# Patient Record
Sex: Female | Born: 1980 | Race: Black or African American | Hispanic: No | Marital: Single | Smoking: Current every day smoker
Health system: Southern US, Community
[De-identification: ages and names within clinical notes are randomized; demographics above are authoritative.]

---

## 2000-08-28 ENCOUNTER — Encounter: Payer: Self-pay | Admitting: Emergency Medicine

## 2000-08-28 ENCOUNTER — Emergency Department (HOSPITAL_COMMUNITY): Admission: EM | Admit: 2000-08-28 | Discharge: 2000-08-28 | Payer: Self-pay | Admitting: Emergency Medicine

## 2001-09-16 ENCOUNTER — Emergency Department (HOSPITAL_COMMUNITY): Admission: EM | Admit: 2001-09-16 | Discharge: 2001-09-16 | Payer: Self-pay | Admitting: Emergency Medicine

## 2001-09-18 ENCOUNTER — Encounter: Payer: Self-pay | Admitting: Emergency Medicine

## 2001-09-18 ENCOUNTER — Emergency Department (HOSPITAL_COMMUNITY): Admission: EM | Admit: 2001-09-18 | Discharge: 2001-09-18 | Payer: Self-pay | Admitting: Emergency Medicine

## 2003-01-29 ENCOUNTER — Emergency Department (HOSPITAL_COMMUNITY): Admission: EM | Admit: 2003-01-29 | Discharge: 2003-01-29 | Payer: Self-pay | Admitting: Emergency Medicine

## 2003-01-31 ENCOUNTER — Emergency Department (HOSPITAL_COMMUNITY): Admission: AD | Admit: 2003-01-31 | Discharge: 2003-01-31 | Payer: Self-pay | Admitting: Family Medicine

## 2003-12-20 ENCOUNTER — Emergency Department (HOSPITAL_COMMUNITY): Admission: EM | Admit: 2003-12-20 | Discharge: 2003-12-20 | Payer: Self-pay | Admitting: Family Medicine

## 2004-06-10 ENCOUNTER — Emergency Department (HOSPITAL_COMMUNITY): Admission: EM | Admit: 2004-06-10 | Discharge: 2004-06-10 | Payer: Self-pay | Admitting: Family Medicine

## 2006-04-06 ENCOUNTER — Emergency Department (HOSPITAL_COMMUNITY): Admission: EM | Admit: 2006-04-06 | Discharge: 2006-04-06 | Payer: Self-pay | Admitting: Emergency Medicine

## 2010-09-13 ENCOUNTER — Emergency Department (HOSPITAL_COMMUNITY)
Admission: EM | Admit: 2010-09-13 | Discharge: 2010-09-13 | Disposition: A | Discharge status: Home / Self Care | Payer: Self-pay | Attending: Emergency Medicine | Admitting: Emergency Medicine

## 2010-09-13 DIAGNOSIS — IMO0001 Reserved for inherently not codable concepts without codable children: Secondary | ICD-10-CM | POA: Insufficient documentation

## 2012-12-22 ENCOUNTER — Emergency Department (HOSPITAL_COMMUNITY)
Admission: EM | Admit: 2012-12-22 | Discharge: 2012-12-22 | Disposition: A | Discharge status: Home / Self Care | Payer: Self-pay | Attending: Emergency Medicine | Admitting: Emergency Medicine

## 2012-12-22 ENCOUNTER — Encounter (HOSPITAL_COMMUNITY): Payer: Self-pay | Admitting: Emergency Medicine

## 2012-12-22 DIAGNOSIS — M5380 Other specified dorsopathies, site unspecified: Secondary | ICD-10-CM | POA: Insufficient documentation

## 2012-12-22 DIAGNOSIS — F17200 Nicotine dependence, unspecified, uncomplicated: Secondary | ICD-10-CM | POA: Insufficient documentation

## 2012-12-22 DIAGNOSIS — M542 Cervicalgia: Secondary | ICD-10-CM | POA: Insufficient documentation

## 2012-12-22 DIAGNOSIS — M62830 Muscle spasm of back: Secondary | ICD-10-CM

## 2012-12-22 DIAGNOSIS — Z3202 Encounter for pregnancy test, result negative: Secondary | ICD-10-CM | POA: Insufficient documentation

## 2012-12-22 LAB — POCT I-STAT, CHEM 8
BUN: 8 mg/dL (ref 6–23)
Calcium, Ion: 1.17 mmol/L (ref 1.12–1.23)
Chloride: 104 mEq/L (ref 96–112)
Glucose, Bld: 86 mg/dL (ref 70–99)
HCT: 50 % — ABNORMAL HIGH (ref 36.0–46.0)
Hemoglobin: 17 g/dL — ABNORMAL HIGH (ref 12.0–15.0)
Potassium: 3.5 mEq/L (ref 3.5–5.1)

## 2012-12-22 LAB — URINALYSIS, ROUTINE W REFLEX MICROSCOPIC
Bilirubin Urine: NEGATIVE
Ketones, ur: NEGATIVE mg/dL
Leukocytes, UA: NEGATIVE
Nitrite: NEGATIVE
Specific Gravity, Urine: 1.011 (ref 1.005–1.030)
Urobilinogen, UA: 0.2 mg/dL (ref 0.0–1.0)
pH: 7 (ref 5.0–8.0)

## 2012-12-22 LAB — POCT PREGNANCY, URINE: Preg Test, Ur: NEGATIVE

## 2012-12-22 MED ORDER — DIAZEPAM 5 MG/ML IJ SOLN
5.0000 mg | Freq: Once | INTRAMUSCULAR | Status: AC
  Administered 2012-12-22: 5 mg via INTRAVENOUS
  Filled 2012-12-22: qty 2

## 2012-12-22 MED ORDER — HYDROMORPHONE HCL PF 1 MG/ML IJ SOLN
1.0000 mg | Freq: Once | INTRAMUSCULAR | Status: AC
  Administered 2012-12-22: 1 mg via INTRAVENOUS
  Filled 2012-12-22: qty 1

## 2012-12-22 MED ORDER — NAPROXEN 500 MG PO TABS: 500.0000 mg | ORAL_TABLET | Freq: Two times a day (BID) | ORAL | Status: AC

## 2012-12-22 MED ORDER — OXYCODONE-ACETAMINOPHEN 5-325 MG PO TABS
1.0000 | ORAL_TABLET | Freq: Once | ORAL | Status: AC
  Administered 2012-12-22: 1 via ORAL
  Filled 2012-12-22: qty 1

## 2012-12-22 MED ORDER — OXYCODONE-ACETAMINOPHEN 5-325 MG PO TABS: 2.0000 | ORAL_TABLET | Freq: Once | ORAL | Status: DC

## 2012-12-22 MED ORDER — METHOCARBAMOL 500 MG PO TABS: 500.0000 mg | ORAL_TABLET | Freq: Two times a day (BID) | ORAL | Status: AC

## 2012-12-22 MED ORDER — SODIUM CHLORIDE 0.9 % IV BOLUS (SEPSIS)
1000.0000 mL | Freq: Once | INTRAVENOUS | Status: AC
  Administered 2012-12-22: 1000 mL via INTRAVENOUS

## 2012-12-22 MED ORDER — ONDANSETRON HCL 4 MG/2ML IJ SOLN: 4.0000 mg | Freq: Once | INTRAMUSCULAR | Status: DC

## 2012-12-22 NOTE — ED Notes (Signed)
RT called

## 2012-12-22 NOTE — Discharge Instructions (Signed)
°  Muscle Cramps and Spasms °Muscle cramps and spasms occur when a muscle or muscles tighten and you have no control over this tightening (involuntary muscle contraction). They are a common problem and can develop in any muscle. The most common place is in the calf muscles of the leg. Both muscle cramps and muscle spasms are involuntary muscle contractions, but they also have differences:  °· Muscle cramps are sporadic and painful. They may last a few seconds to a quarter of an hour. Muscle cramps are often more forceful and last longer than muscle spasms. °· Muscle spasms may or may not be painful. They may also last just a few seconds or much longer. °CAUSES  °It is uncommon for cramps or spasms to be due to a serious underlying problem. In many cases, the cause of cramps or spasms is unknown. Some common causes are:  °· Overexertion.   °· Overuse from repetitive motions (doing the same thing over and over).   °· Remaining in a certain position for a long period of time.   °· Improper preparation, form, or technique while performing a sport or activity.   °· Dehydration.   °· Injury.   °· Side effects of some medicines.   °· Abnormally low levels of the salts and ions in your blood (electrolytes), especially potassium and calcium. This could happen if you are taking water pills (diuretics) or you are pregnant.   °Some underlying medical problems can make it more likely to develop cramps or spasms. These include, but are not limited to:  °· Diabetes.   °· Parkinson disease.   °· Hormone disorders, such as thyroid problems.   °· Alcohol abuse.   °· Diseases specific to muscles, joints, and bones.   °· Blood vessel disease where not enough blood is getting to the muscles.   °HOME CARE INSTRUCTIONS  °· Stay well hydrated. Drink enough water and fluids to keep your urine clear or pale yellow. °· It may be helpful to massage, stretch, and relax the affected muscle. °· For tight or tense muscles, use a warm towel, heating  pad, or hot shower water directed to the affected area. °· If you are sore or have pain after a cramp or spasm, applying ice to the affected area may relieve discomfort. °· Put ice in a plastic bag. °· Place a towel between your skin and the bag. °· Leave the ice on for 15-20 minutes, 03-04 times a day. °· Medicines used to treat a known cause of cramps or spasms may help reduce their frequency or severity. Only take over-the-counter or prescription medicines as directed by your caregiver. °SEEK MEDICAL CARE IF:  °Your cramps or spasms get more severe, more frequent, or do not improve over time.  °MAKE SURE YOU:  °· Understand these instructions. °· Will watch your condition. °· Will get help right away if you are not doing well or get worse. °Document Released: 06/25/2001 Document Revised: 04/30/2012 Document Reviewed: 12/21/2011 °ExitCare® Patient Information ©2014 [employer]. ° ° °

## 2012-12-22 NOTE — ED Provider Notes (Signed)
CSN: [id number]     Arrival date & time 12/22/12  1000 History   First MD Initiated Contact with Patient 12/22/12 1030     Chief Complaint  Patient presents with  . Allergic Reaction   (Consider location/radiation/quality/duration/timing/severity/associated sxs/prior Treatment) HPI  32 year old moderately obese females presents complaining of neck and back pain. Patient is generally healthy. This morning she was awoke with burning pain to both legs from the calf to the thigh. Pain lasting for about an hour and half and then radiates up to her low back. She is now having significant back spasm and sharp pain. Pain is severe causing her to have difficulty breathing. Pain was intense causing her to vomit once. Vomitus is nonbloody nonbilious. Patient tried taking Benadryl at home with minimal relief. Patient thought she may have been bitten by a bug but did not see any bugs. Patient endorsed chills. She denies fever, productive cough, hemoptysis, dysuria, rash. She denies any recent strenuous activities exercise. She denies travelling through the woods. No recent change in medication or environmental changes. She has never had this pain before. Right now pain is most intense to her low back with back spasm.  History reviewed. No pertinent past medical history. History reviewed. No pertinent past surgical history. No family history on file. History  Substance Use Topics  . Smoking status: Current Every Day Smoker  . Smokeless tobacco: Not on file  . Alcohol Use: No   OB History   Grav Para Term Preterm Abortions TAB SAB Ect Mult Living                 Review of Systems  All other systems reviewed and are negative.    Allergies  Review of patient's allergies indicates no known allergies.  Home Medications   Current Outpatient Rx  Name  Route  Sig  Dispense  Refill  . diphenhydrAMINE (BENADRYL) 25 MG tablet   Oral   Take 25 mg by mouth every 6 (six) hours as needed for itching or  allergies.          BP 155/94  Pulse 104  Temp(Src) 97.7 F (36.5 C) (Oral)  Resp 20  SpO2 100%  LMP 11/30/2012 Physical Exam  Nursing note and vitals reviewed. Constitutional: She appears well-developed and well-nourished. No distress.  Morbidly obese, appears uncomfortable.  HENT:  Head: Atraumatic.  Eyes: Conjunctivae are normal.  Neck: Neck supple.  Cardiovascular: Normal rate and regular rhythm.   Pulmonary/Chest: Effort normal and breath sounds normal.  Abdominal: Soft. There is tenderness (Mild generalized tenderness without focal point tenderness.).  Musculoskeletal: She exhibits tenderness (Moderate tenderness to thoracic and lumbar spine on palpation but spine is without crepitus, or step-off. No overlying skin changes. Tenderness to paralumbar with decreased range of motion secondary to pain.).  Neurological: She is alert.  Intact distal pedal pulses bilaterally, patellar deep tendon reflex intact, no foot drop, 5 out 5 strength to all 4 extremities.  Skin: No rash noted.  Psychiatric: She has a normal mood and affect.    ED Course  Procedures (including critical care time)  Patient here with back spasm and pain, appears to be uncomfortable. She is neurovascularly intact. No evidence of airway compromise. Pain is reproducible on exam. No recent trauma to suggest acute fractures or dislocation. No dysuria or hematuria to suggest GU or kidney stone related etiology. Plan to check basic labs, UA, pregnancy test. Will get pain medication and muscle relaxant and will continue to monitor. IV fluid  given as patient complains of nausea or vomiting.  1:16 PM Pt has been sleeping and resting comfortably after administration of pain medication and muscle relaxant.  Is afebrile, VSS.  Pregnancy test negative, UA unremarkable, electrolytes are within normal limit.  Will treat her sxs with NSAIDs and muscle relaxant.  Return precaution given.  I do not think pt has DVT, kidney stone,  pyelonephritis, polio, or Guillian Barre based on initial evaluation.    Labs Review Labs Reviewed  POCT I-STAT, CHEM 8 - Abnormal; Notable for the following:    Hemoglobin 17.0 (*)    HCT 50.0 (*)    All other components within normal limits  URINALYSIS, ROUTINE W REFLEX MICROSCOPIC  POCT PREGNANCY, URINE   Imaging Review No results found.  EKG Interpretation   None       MDM   1. Spasm of back muscles    BP 115/66  Pulse 54  Temp(Src) 97.7 F (36.5 C) (Oral)  Resp 18  SpO2 98%  LMP 11/30/2012  I have reviewed nursing notes and vital signs. I reviewed available ER/hospitalization records thought the EMR     Fayrene Helper, [place] 12/22/12 1318
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
## 2012-12-22 NOTE — ED Notes (Signed)
Per pt woke up this am with pain and numbness on both legs-moved up to chest-states difficulty breathing, having back spasms-crying

## 2012-12-23 NOTE — ED Provider Notes (Signed)
Medical screening examination/treatment/procedure(s) were performed by non-physician practitioner and as supervising physician I was immediately available for consultation/collaboration.  EKG Interpretation   None        Derwood Kaplan, MD 12/23/12 1601

## 2015-05-28 ENCOUNTER — Ambulatory Visit (INDEPENDENT_AMBULATORY_CARE_PROVIDER_SITE_OTHER): Payer: Managed Care, Other (non HMO)

## 2015-05-28 ENCOUNTER — Ambulatory Visit (INDEPENDENT_AMBULATORY_CARE_PROVIDER_SITE_OTHER): Payer: Managed Care, Other (non HMO) | Admitting: Physician Assistant

## 2015-05-28 VITALS — BP 130/78 | HR 94 | Temp 98.9°F | Resp 18 | Wt 266.0 lb

## 2015-05-28 DIAGNOSIS — M549 Dorsalgia, unspecified: Secondary | ICD-10-CM | POA: Diagnosis not present

## 2015-05-28 DIAGNOSIS — R938 Abnormal findings on diagnostic imaging of other specified body structures: Secondary | ICD-10-CM

## 2015-05-28 DIAGNOSIS — M25511 Pain in right shoulder: Secondary | ICD-10-CM | POA: Diagnosis not present

## 2015-05-28 DIAGNOSIS — M25561 Pain in right knee: Secondary | ICD-10-CM

## 2015-05-28 DIAGNOSIS — T148XXA Other injury of unspecified body region, initial encounter: Secondary | ICD-10-CM

## 2015-05-28 DIAGNOSIS — R9389 Abnormal findings on diagnostic imaging of other specified body structures: Secondary | ICD-10-CM

## 2015-05-28 LAB — CBC
HCT: 42.4 % (ref 35.0–45.0)
Hemoglobin: 13.1 g/dL (ref 11.7–15.5)
MCH: 22.3 pg — ABNORMAL LOW (ref 27.0–33.0)
MCHC: 30.9 g/dL — AB (ref 32.0–36.0)
MCV: 72.2 fL — ABNORMAL LOW (ref 80.0–100.0)
MPV: 9.9 fL (ref 7.5–12.5)
PLATELETS: 324 10*3/uL (ref 140–400)
RBC: 5.87 MIL/uL — ABNORMAL HIGH (ref 3.80–5.10)
RDW: 16.9 % — AB (ref 11.0–15.0)
WBC: 9.6 10*3/uL (ref 3.8–10.8)

## 2015-05-28 MED ORDER — CYCLOBENZAPRINE HCL 5 MG PO TABS: 5.0000 mg | ORAL_TABLET | Freq: Three times a day (TID) | ORAL | Status: AC | PRN

## 2015-05-28 MED ORDER — MELOXICAM 7.5 MG PO TABS: 7.5000 mg | ORAL_TABLET | Freq: Every day | ORAL | Status: AC
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
## 2015-05-31 LAB — VITAMIN D 1,25 DIHYDROXY
VITAMIN D3 1, 25 (OH): 83 pg/mL
Vitamin D 1, 25 (OH)2 Total: 83 pg/mL — ABNORMAL HIGH (ref 18–72)
Vitamin D2 1, 25 (OH)2: <8 pg/mL
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
